# Patient Record
Sex: Female | Born: 1945 | Race: Black or African American | Hispanic: No | Marital: Married | Smoking: Never smoker
Health system: Southern US, Community
[De-identification: ages and names within clinical notes are randomized; demographics above are authoritative.]

## PROBLEM LIST (undated history)

## (undated) DIAGNOSIS — E7800 Pure hypercholesterolemia, unspecified: Secondary | ICD-10-CM

## (undated) DIAGNOSIS — F419 Anxiety disorder, unspecified: Secondary | ICD-10-CM

## (undated) DIAGNOSIS — M109 Gout, unspecified: Secondary | ICD-10-CM

## (undated) DIAGNOSIS — H409 Unspecified glaucoma: Secondary | ICD-10-CM

## (undated) DIAGNOSIS — I10 Essential (primary) hypertension: Secondary | ICD-10-CM

## (undated) HISTORY — PX: TUBAL LIGATION: SHX77

## (undated) HISTORY — PX: KNEE ARTHROSCOPY: SUR90

---

## 2017-03-17 ENCOUNTER — Emergency Department (HOSPITAL_COMMUNITY): Payer: Medicare HMO

## 2017-03-17 ENCOUNTER — Other Ambulatory Visit: Payer: Self-pay

## 2017-03-17 ENCOUNTER — Emergency Department (HOSPITAL_COMMUNITY)
Admission: EM | Admit: 2017-03-17 | Discharge: 2017-03-18 | Disposition: A | Discharge status: Home / Self Care | Payer: Medicare HMO | Attending: Emergency Medicine | Admitting: Emergency Medicine

## 2017-03-17 ENCOUNTER — Encounter (HOSPITAL_COMMUNITY): Payer: Self-pay

## 2017-03-17 DIAGNOSIS — Z7982 Long term (current) use of aspirin: Secondary | ICD-10-CM | POA: Insufficient documentation

## 2017-03-17 DIAGNOSIS — J90 Pleural effusion, not elsewhere classified: Secondary | ICD-10-CM | POA: Diagnosis not present

## 2017-03-17 DIAGNOSIS — Z79899 Other long term (current) drug therapy: Secondary | ICD-10-CM | POA: Diagnosis not present

## 2017-03-17 DIAGNOSIS — R0600 Dyspnea, unspecified: Secondary | ICD-10-CM | POA: Diagnosis not present

## 2017-03-17 DIAGNOSIS — I10 Essential (primary) hypertension: Secondary | ICD-10-CM | POA: Diagnosis not present

## 2017-03-17 DIAGNOSIS — R0602 Shortness of breath: Secondary | ICD-10-CM | POA: Diagnosis present

## 2017-03-17 HISTORY — DX: Essential (primary) hypertension: I10

## 2017-03-17 HISTORY — DX: Unspecified glaucoma: H40.9

## 2017-03-17 HISTORY — DX: Pure hypercholesterolemia, unspecified: E78.00

## 2017-03-17 HISTORY — DX: Anxiety disorder, unspecified: F41.9

## 2017-03-17 HISTORY — DX: Gout, unspecified: M10.9

## 2017-03-17 LAB — CBC
HEMATOCRIT: 32.5 % — AB (ref 36.0–46.0)
Hemoglobin: 10.5 g/dL — ABNORMAL LOW (ref 12.0–15.0)
MCH: 29.5 pg (ref 26.0–34.0)
MCHC: 32.3 g/dL (ref 30.0–36.0)
MCV: 91.3 fL (ref 78.0–100.0)
Platelets: 256 10*3/uL (ref 150–400)
RBC: 3.56 MIL/uL — AB (ref 3.87–5.11)
RDW: 14.4 % (ref 11.5–15.5)
WBC: 8.9 10*3/uL (ref 4.0–10.5)

## 2017-03-17 LAB — BASIC METABOLIC PANEL
ANION GAP: 10 (ref 5–15)
BUN: 11 mg/dL (ref 6–20)
CO2: 26 mmol/L (ref 22–32)
Calcium: 8.9 mg/dL (ref 8.9–10.3)
Chloride: 103 mmol/L (ref 101–111)
Creatinine, Ser: 0.96 mg/dL (ref 0.44–1.00)
GFR calc non Af Amer: 59 mL/min — ABNORMAL LOW (ref >60)
GLUCOSE: 93 mg/dL (ref 65–99)
POTASSIUM: 3.3 mmol/L — AB (ref 3.5–5.1)
Sodium: 139 mmol/L (ref 135–145)

## 2017-03-17 LAB — D-DIMER, QUANTITATIVE: D-Dimer, Quant: 0.82 ug/mL-FEU — ABNORMAL HIGH (ref 0.00–0.50)

## 2017-03-17 LAB — BRAIN NATRIURETIC PEPTIDE: B NATRIURETIC PEPTIDE 5: 404.2 pg/mL — AB (ref 0.0–100.0)

## 2017-03-17 MED ORDER — AMLODIPINE BESYLATE 5 MG PO TABS
5.0000 mg | ORAL_TABLET | Freq: Once | ORAL | Status: AC
  Administered 2017-03-18: 5 mg via ORAL
  Filled 2017-03-17: qty 1

## 2017-03-17 NOTE — ED Provider Notes (Signed)
[facility]-EMERGENCY DEPT Provider Note   CSN: [id number] Arrival date & time: 03/17/17  1656     History   Chief Complaint Chief Complaint  Patient presents with  . Shortness of Breath    HPI Emily Boyle is a 72 y.o. female.  HPI [place] from [place].    She noticed these sx on Friday.  She woke up feeling short of breath.  The sx get worse when walking but resolve when she is at rest.  She denies any history of heart or lung disease, pe, copd, chf.  She did have asthma as a child but nothing recently. Past Medical History:  Diagnosis Date  . Anxiety   . Glaucoma   . Gout   . High cholesterol   . Hypertension     There are no active problems to display for this patient.   Past Surgical History:  Procedure Laterality Date  . KNEE ARTHROSCOPY    . TUBAL LIGATION      OB History    No data available       Home Medications    Prior to Admission medications   Medication Sig Start Date End Date Taking? Authorizing Provider  ALPRAZolam Prudy Feeler(XANAX) 0.5 MG tablet Take 0.5 mg by mouth at bedtime as needed for anxiety.   Yes [provider]  aspirin EC 81 MG tablet Take 81 mg by mouth daily.   Yes [provider]  atorvastatin (LIPITOR) 40 MG tablet Take 40 mg by mouth daily.   Yes [provider]  brimonidine-timolol (COMBIGAN) 0.2-0.5 % ophthalmic solution Place 1 drop into both eyes every 12 (twelve) hours.   Yes [provider]  Cholecalciferol (VITAMIN D) 2000 units tablet Take 2,000 Units by mouth daily.   Yes [provider]  losartan (COZAAR) 100 MG tablet Take 100 mg by mouth daily. 03/06/17  Yes [provider]  Multiple Vitamins-Minerals (MULTIVITAMIN ADULTS) TABS Take 1 tablet by mouth daily.   Yes [provider]  travoprost, benzalkonium, (TRAVATAN) 0.004 % ophthalmic solution Place 1 drop into both eyes at bedtime.   Yes [provider]    vitamin C (ASCORBIC ACID) 500 MG tablet Take 500 mg by mouth daily.   Yes [provider]  COLCRYS 0.6 MG tablet Take 0.6 mg by mouth daily as needed (gout).  02/10/17   [provider]  ibuprofen (ADVIL,MOTRIN) 600 MG tablet TAKE 1 TABLET BY MOUTH 3 TIMES A DAILY AS NEEDED FOR PAIN WITH FOOD 12/18/16   [provider]  meloxicam (MOBIC) 15 MG tablet Take 15 mg by mouth daily as needed for pain.  03/06/17   [provider]  pantoprazole (PROTONIX) 20 MG tablet 1 (ONE) TABLET EVERY NIGHT AT BEDTIME WHILE USING MELOXICAM 03/06/17   [provider]  Losartan use to be on 100/25  Family History No family history on file.  Social History Social History   Tobacco Use  . Smoking status: Never Smoker  . Smokeless tobacco: Never Used  Substance Use Topics  . Alcohol use: Yes    Frequency: Never    Comment: rare  . Drug use: No     Allergies   Shellfish allergy   Review of Systems Review of Systems  All other systems reviewed and are negative.    Physical Exam Updated Vital Signs BP (!) 195/99 (BP Location: Left Arm)   Pulse 89   Temp 98.1 F (36.7 C) (Oral)  Resp (!) 22   Ht 1.626 m (5\' 4" )   Wt 115.7 kg (255 lb)   SpO2 97%   BMI 43.77 kg/m   Physical Exam  Constitutional: She appears well-developed and well-nourished. No distress.  HENT:  Head: Normocephalic and atraumatic.  Right Ear: External ear normal.  Left Ear: External ear normal.  Eyes: Conjunctivae are normal. Right eye exhibits no discharge. Left eye exhibits no discharge. No scleral icterus.  Neck: Neck supple. No tracheal deviation present.  Cardiovascular: Normal rate, regular rhythm and intact distal pulses.  Pulmonary/Chest: Effort normal and breath sounds normal. No stridor. No respiratory distress. She has no wheezes. She has no rales.  Abdominal: Soft. Bowel sounds are normal. She exhibits no distension. There is no tenderness. There is no rebound and no  guarding.  Musculoskeletal: She exhibits no edema or tenderness.  Neurological: She is alert. She has normal strength. No cranial nerve deficit (no facial droop, extraocular movements intact, no slurred speech) or sensory deficit. She exhibits normal muscle tone. She displays no seizure activity. Coordination normal.  Skin: Skin is warm and dry. No rash noted.  Psychiatric: She has a normal mood and affect.  Nursing note and vitals reviewed.    ED Treatments / Results  Labs (all labs ordered are listed, but only abnormal results are displayed) Labs Reviewed  CBC - Abnormal; Notable for the following components:      Result Value   RBC 3.56 (*)    Hemoglobin 10.5 (*)    HCT 32.5 (*)    All other components within normal limits  BASIC METABOLIC PANEL - Abnormal; Notable for the following components:   Potassium 3.3 (*)    GFR calc non Af Amer 59 (*)    All other components within normal limits  D-DIMER, QUANTITATIVE (NOT AT [facility]) - Abnormal; Notable for the following components:   D-Dimer, Quant 0.82 (*)    All other components within normal limits  BRAIN NATRIURETIC PEPTIDE - Abnormal; Notable for the following components:   B Natriuretic Peptide 404.2 (*)    All other components within normal limits    EKG  EKG Interpretation  Date/Time:  Tuesday March 17 2017 17:15:06 EDT Ventricular Rate:  83 PR Interval:    QRS Duration: 75 QT Interval:  364 QTC Calculation: 428 R Axis:   -5 Text Interpretation:  Sinus rhythm Low voltage, precordial leads No old tracing to compare Confirmed by Linwood Dibbles [phone]) on 03/18/2017 12:10:02 AM       Radiology Dg Chest 2 View  Result Date: 03/17/2017 CLINICAL DATA:  Initial evaluation for increased shortness of breath, wheezing. History of asthma, hypertension. EXAM: CHEST - 2 VIEW COMPARISON:  None. FINDINGS: Mild cardiomegaly.  Mediastinal silhouette normal. Lungs mildly hypoinflated. Scattered diffuse peribronchial thickening which may  be related history of asthma or possibly bronchiolitis. Perihilar vascular congestion without pulmonary edema. No focal infiltrates. No significant pleural effusion. No pneumothorax. No acute osseous abnormality. IMPRESSION: 1. Scattered peribronchial thickening, suspected be related history of asthma. Bronchiolitis could be considered in the correct clinical setting. 2. Mild cardiomegaly with perihilar vascular congestion without pulmonary edema. Electronically Signed   By: Rise Mu M.D.   On: 03/17/2017 17:48    Procedures Procedures (including critical care time)  Medications Ordered in ED Medications  amLODipine (NORVASC) tablet 5 mg (not administered)     Initial Impression / Assessment and Plan / ED Course  I have reviewed the triage vital signs and the nursing notes.  Pertinent labs & imaging results that were available during my care of the patient were reviewed by me and considered in my medical decision making (see chart for details).  Clinical Course as of Mar 18 9  Wed Mar 18, 2017  0010 Labs notable for mild anemia.  I doubt this is clinically significant.  BNP is elevated but no overt chf.  Will ct to evaluate for PE.  If negative may consider adding a diuretic.   [JK]    Clinical Course User Index [JK] Linwood Dibbles, MD    Will turn over to Dr Nicanor Alcon pending the CT angio of the chest.  Final Clinical Impressions(s) / ED Diagnoses   Final diagnoses:  None    ED Discharge Orders    None       Linwood Dibbles, MD 03/19/17 [phone]

## 2017-03-17 NOTE — ED Triage Notes (Signed)
Patient c/o SOB x 2 days and has gotten progressively worse. Patient states SOB worse when ambulating.

## 2017-03-18 ENCOUNTER — Encounter (HOSPITAL_COMMUNITY): Payer: Self-pay

## 2017-03-18 ENCOUNTER — Emergency Department (HOSPITAL_COMMUNITY): Payer: Medicare HMO

## 2017-03-18 LAB — I-STAT TROPONIN, ED: TROPONIN I, POC: 0 ng/mL (ref 0.00–0.08)

## 2017-03-18 MED ORDER — SODIUM CHLORIDE 0.9 % IJ SOLN
INTRAMUSCULAR | Status: AC
  Administered 2017-03-18: 01:00:00
  Filled 2017-03-18: qty 50

## 2017-03-18 MED ORDER — IOPAMIDOL (ISOVUE-370) INJECTION 76%
INTRAVENOUS | Status: AC
  Filled 2017-03-18: qty 100

## 2017-03-18 MED ORDER — IOPAMIDOL (ISOVUE-370) INJECTION 76%
100.0000 mL | Freq: Once | INTRAVENOUS | Status: AC | PRN
  Administered 2017-03-18: 100 mL via INTRAVENOUS

## 2017-03-18 MED ORDER — HYDROCHLOROTHIAZIDE 25 MG PO TABS: 25.0000 mg | ORAL_TABLET | Freq: Every day | ORAL | 0 refills | Status: AC

## 2017-03-18 NOTE — Discharge Instructions (Signed)
Repeat Chest CT in 3 to 6 months to assess stability of mediastinal lymph nodes seen on CT scan

## 2019-05-18 IMAGING — CR DG CHEST 2V
2 series · 2 of 2 positions shown · non-contrast
Comparison: None.

CLINICAL DATA: Initial evaluation for increased shortness of
breath, wheezing. History of asthma, hypertension.

EXAM:
CHEST - 2 VIEW

[w chest pa]
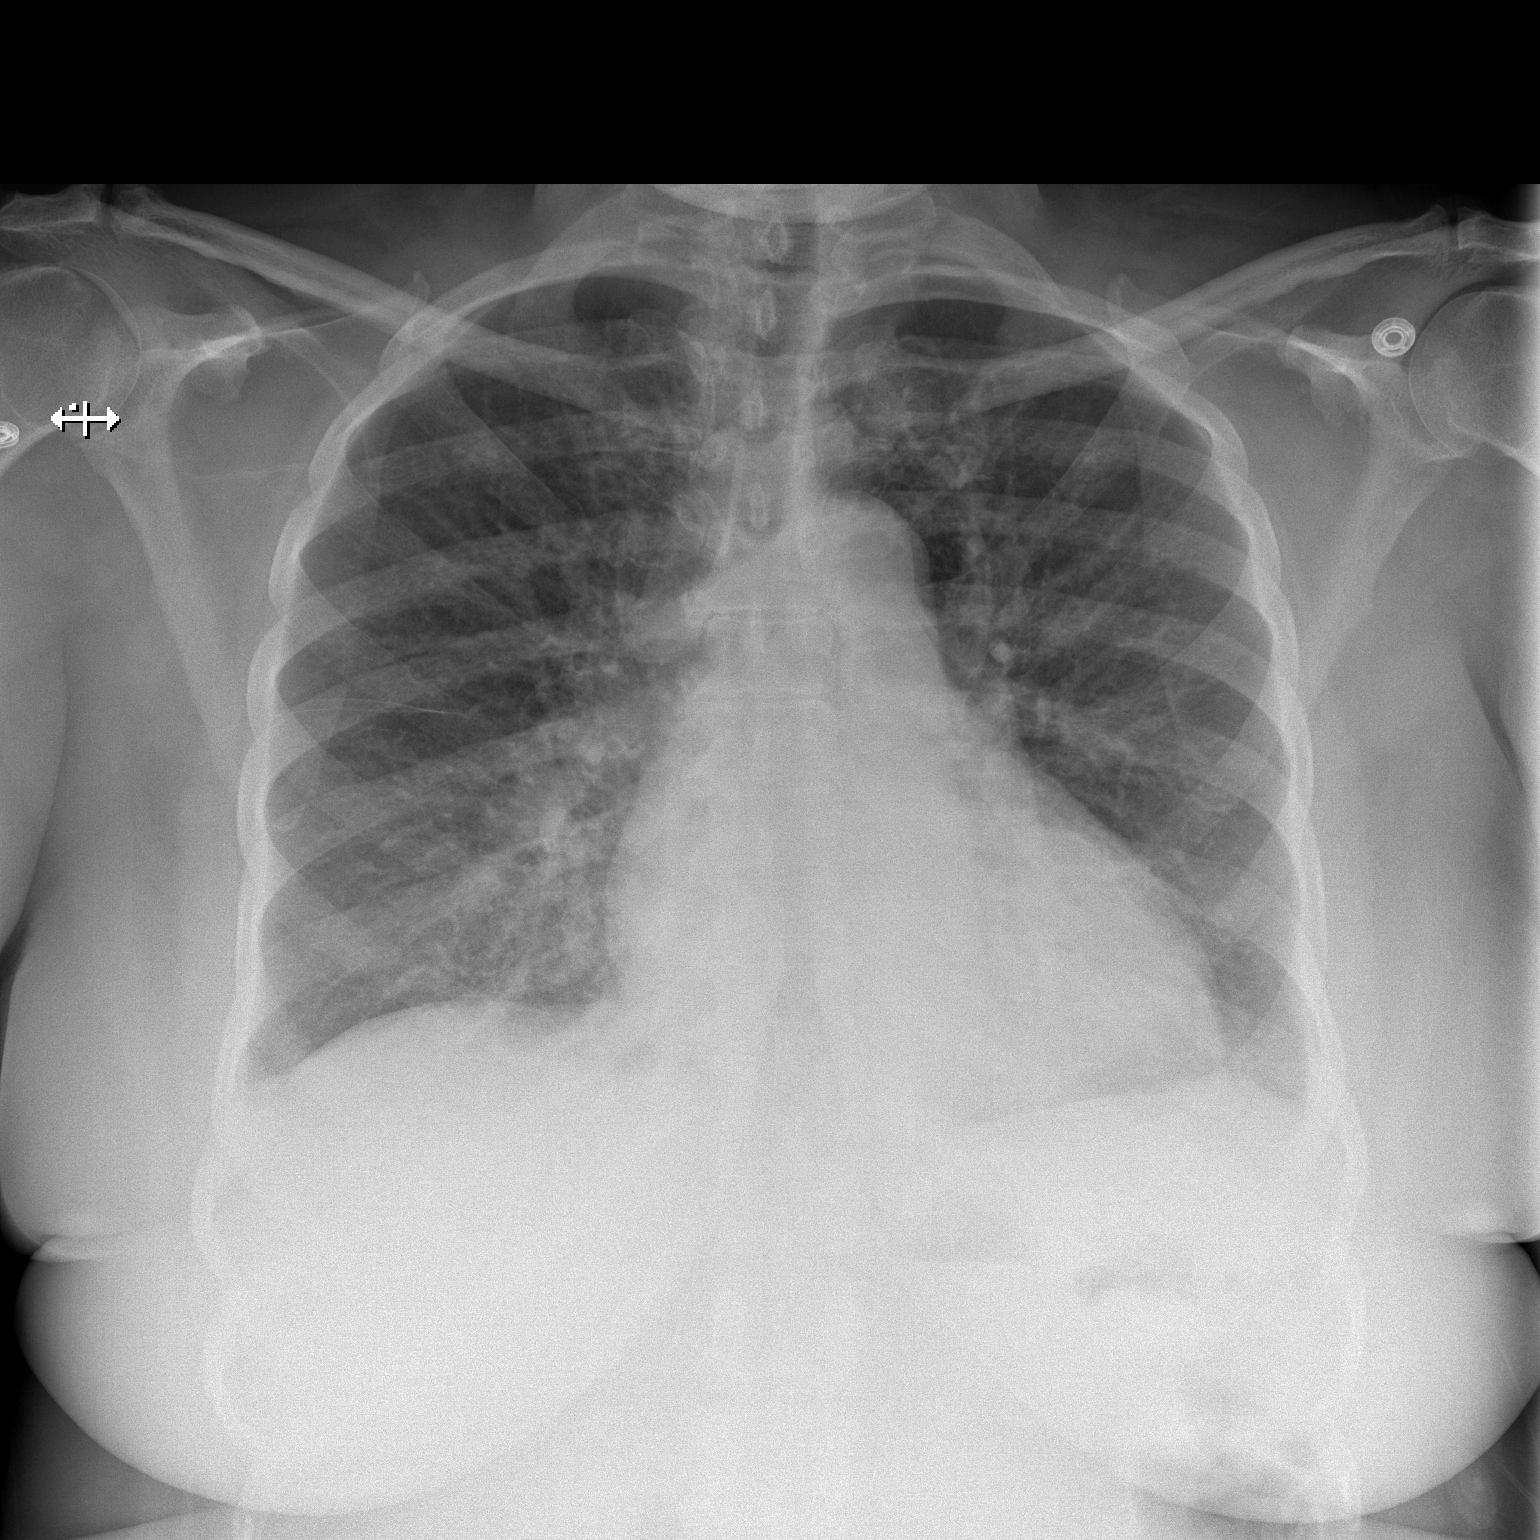

[w chest lat]
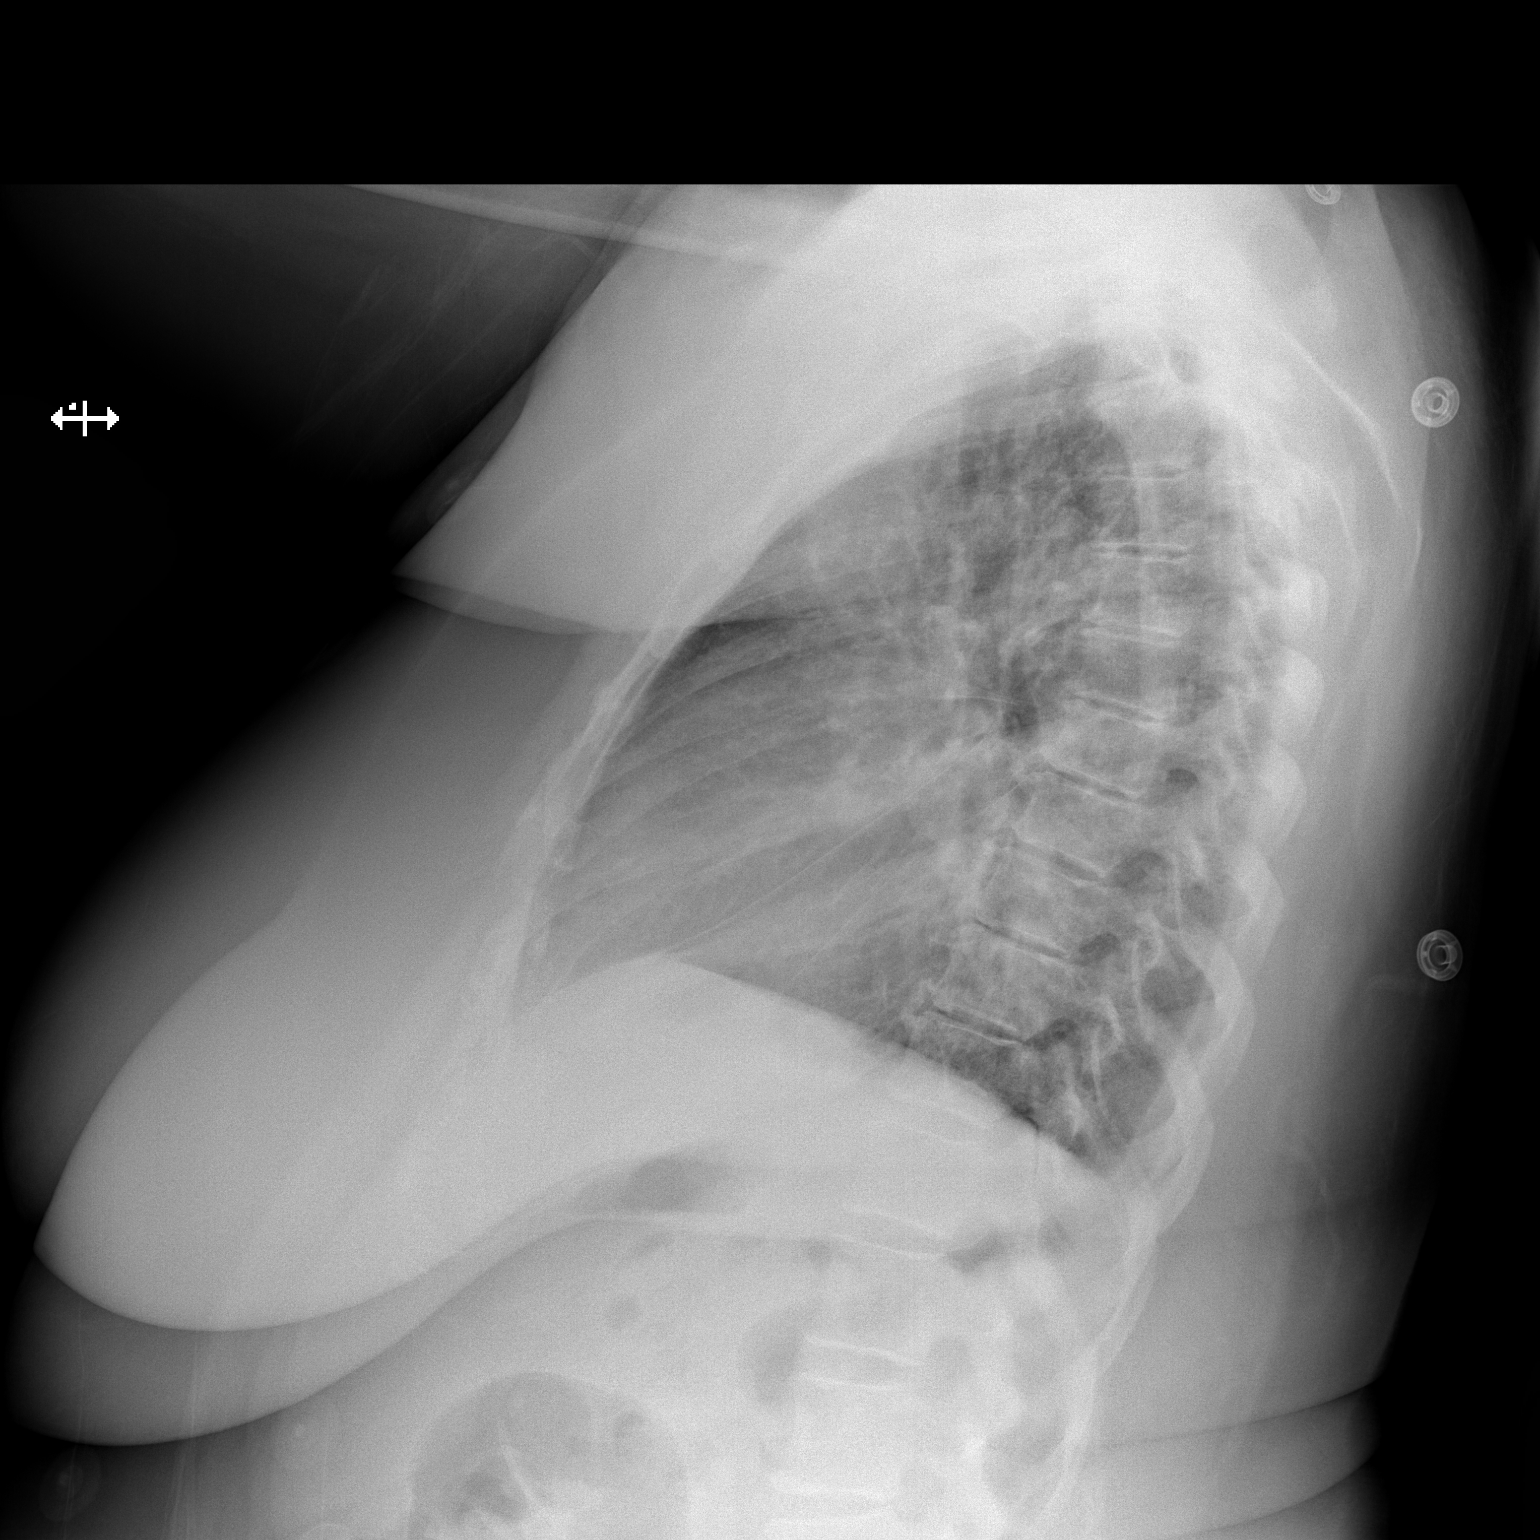

[2 of 2 positions shown; findings below may reference images not displayed]

FINDINGS: Mild cardiomegaly.  Mediastinal silhouette normal.

Lungs mildly hypoinflated. Scattered diffuse peribronchial
thickening which may be related history of asthma or possibly
bronchiolitis. Perihilar vascular congestion without pulmonary
edema. No focal infiltrates. No significant pleural effusion. No
pneumothorax.

No acute osseous abnormality.
IMPRESSION: 1. Scattered peribronchial thickening, suspected be related history
of asthma. Bronchiolitis could be considered in the correct clinical
setting.
2. Mild cardiomegaly with perihilar vascular congestion without
pulmonary edema.
